# Patient Record
Sex: Male | Born: 1990 | Race: Black or African American | Hispanic: No | Marital: Single
Health system: Southern US, Community
[De-identification: ages and names within clinical notes are randomized; demographics above are authoritative.]

## PROBLEM LIST (undated history)

## (undated) DIAGNOSIS — R569 Unspecified convulsions: Secondary | ICD-10-CM

## (undated) DIAGNOSIS — B20 Human immunodeficiency virus [HIV] disease: Secondary | ICD-10-CM

## (undated) DIAGNOSIS — Z21 Asymptomatic human immunodeficiency virus [HIV] infection status: Secondary | ICD-10-CM

## (undated) DIAGNOSIS — F319 Bipolar disorder, unspecified: Secondary | ICD-10-CM

---

## 2018-01-04 ENCOUNTER — Encounter (HOSPITAL_COMMUNITY): Payer: Self-pay | Admitting: Emergency Medicine

## 2018-01-04 ENCOUNTER — Other Ambulatory Visit: Payer: Self-pay

## 2018-01-04 ENCOUNTER — Emergency Department (HOSPITAL_COMMUNITY)
Admission: EM | Admit: 2018-01-04 | Discharge: 2018-01-04 | Disposition: A | Discharge status: Home / Self Care | Payer: Medicaid Other | Attending: Emergency Medicine | Admitting: Emergency Medicine

## 2018-01-04 DIAGNOSIS — R112 Nausea with vomiting, unspecified: Secondary | ICD-10-CM | POA: Insufficient documentation

## 2018-01-04 DIAGNOSIS — Z5321 Procedure and treatment not carried out due to patient leaving prior to being seen by health care provider: Secondary | ICD-10-CM | POA: Diagnosis not present

## 2018-01-04 HISTORY — DX: Bipolar disorder, unspecified: F31.9

## 2018-01-04 HISTORY — DX: Unspecified convulsions: R56.9

## 2018-01-04 HISTORY — DX: Asymptomatic human immunodeficiency virus (hiv) infection status: Z21

## 2018-01-04 HISTORY — DX: Human immunodeficiency virus (HIV) disease: B20

## 2018-01-04 LAB — CBC
HCT: 41.8 % (ref 39.0–52.0)
Hemoglobin: 14.6 g/dL (ref 13.0–17.0)
MCH: 29.6 pg (ref 26.0–34.0)
MCHC: 34.9 g/dL (ref 30.0–36.0)
MCV: 84.8 fL (ref 78.0–100.0)
Platelets: 185 10*3/uL (ref 150–400)
RBC: 4.93 MIL/uL (ref 4.22–5.81)
RDW: 14.5 % (ref 11.5–15.5)
WBC: 16.4 10*3/uL — ABNORMAL HIGH (ref 4.0–10.5)

## 2018-01-04 LAB — COMPREHENSIVE METABOLIC PANEL
ALK PHOS: 54 U/L (ref 38–126)
ALT: 15 U/L — ABNORMAL LOW (ref 17–63)
AST: 25 U/L (ref 15–41)
Albumin: 4.3 g/dL (ref 3.5–5.0)
Anion gap: 9 (ref 5–15)
BUN: 16 mg/dL (ref 6–20)
CALCIUM: 9.9 mg/dL (ref 8.9–10.3)
CHLORIDE: 96 mmol/L — AB (ref 101–111)
CO2: 29 mmol/L (ref 22–32)
CREATININE: 1.33 mg/dL — AB (ref 0.61–1.24)
GFR calc Af Amer: >60 mL/min (ref >60)
GFR calc non Af Amer: >60 mL/min (ref >60)
Glucose, Bld: 116 mg/dL — ABNORMAL HIGH (ref 65–99)
Potassium: 4.1 mmol/L (ref 3.5–5.1)
SODIUM: 134 mmol/L — AB (ref 135–145)
Total Bilirubin: 0.5 mg/dL (ref 0.3–1.2)
Total Protein: 8.5 g/dL — ABNORMAL HIGH (ref 6.5–8.1)

## 2018-01-04 LAB — LIPASE, BLOOD: LIPASE: 30 U/L (ref 11–51)

## 2018-01-04 MED ORDER — ONDANSETRON 4 MG PO TBDP
4.0000 mg | ORAL_TABLET | Freq: Once | ORAL | Status: AC | PRN
  Administered 2018-01-04: 4 mg via ORAL
  Filled 2018-01-04: qty 1

## 2018-01-04 NOTE — ED Notes (Signed)
PATIENT CALLED THREE TIMES WITH NO ANSWER,PARKING AND BATHROOM CHECKED

## 2018-01-04 NOTE — ED Notes (Signed)
No response when call from lobby.

## 2018-01-04 NOTE — ED Notes (Signed)
Patient handed patient stickers to registration reporting they are not waiting due to wait time.

## 2018-01-04 NOTE — ED Triage Notes (Signed)
Per EMS pt complaint n/v/d onset 0630 yesterday.

## 2018-02-28 ENCOUNTER — Emergency Department (HOSPITAL_COMMUNITY)
Admission: EM | Admit: 2018-02-28 | Discharge: 2018-02-28 | Disposition: A | Discharge status: Home / Self Care | Payer: Medicaid Other | Attending: Emergency Medicine | Admitting: Emergency Medicine

## 2018-02-28 ENCOUNTER — Other Ambulatory Visit: Payer: Self-pay

## 2018-02-28 ENCOUNTER — Encounter (HOSPITAL_COMMUNITY): Payer: Self-pay | Admitting: *Deleted

## 2018-02-28 ENCOUNTER — Emergency Department (HOSPITAL_COMMUNITY): Payer: Medicaid Other

## 2018-02-28 DIAGNOSIS — R079 Chest pain, unspecified: Secondary | ICD-10-CM | POA: Diagnosis present

## 2018-02-28 DIAGNOSIS — Z5321 Procedure and treatment not carried out due to patient leaving prior to being seen by health care provider: Secondary | ICD-10-CM | POA: Insufficient documentation

## 2018-02-28 DIAGNOSIS — F1410 Cocaine abuse, uncomplicated: Secondary | ICD-10-CM

## 2018-02-28 LAB — CBC
HCT: 39.5 % (ref 39.0–52.0)
Hemoglobin: 13 g/dL (ref 13.0–17.0)
MCH: 29 pg (ref 26.0–34.0)
MCHC: 32.9 g/dL (ref 30.0–36.0)
MCV: 88 fL (ref 78.0–100.0)
Platelets: 187 10*3/uL (ref 150–400)
RBC: 4.49 MIL/uL (ref 4.22–5.81)
RDW: 14.6 % (ref 11.5–15.5)
WBC: 5.2 10*3/uL (ref 4.0–10.5)

## 2018-02-28 LAB — BASIC METABOLIC PANEL
ANION GAP: 9 (ref 5–15)
BUN: 5 mg/dL — ABNORMAL LOW (ref 6–20)
CHLORIDE: 105 mmol/L (ref 101–111)
CO2: 24 mmol/L (ref 22–32)
Calcium: 9.4 mg/dL (ref 8.9–10.3)
Creatinine, Ser: 0.93 mg/dL (ref 0.61–1.24)
GFR calc Af Amer: >60 mL/min (ref >60)
GLUCOSE: 84 mg/dL (ref 65–99)
POTASSIUM: 4 mmol/L (ref 3.5–5.1)
Sodium: 138 mmol/L (ref 135–145)

## 2018-02-28 LAB — I-STAT TROPONIN, ED: TROPONIN I, POC: 0 ng/mL (ref 0.00–0.08)

## 2018-02-28 MED ORDER — IBUPROFEN 200 MG PO TABS: 600.0000 mg | ORAL_TABLET | Freq: Once | ORAL | Status: DC

## 2018-02-28 NOTE — ED Notes (Signed)
Pt requesting to leave AMA, pt states, "I have been here and no one is doing anything. I am just going to go." pt encouraged to remain to be seen, pt declines, pt encouraged to come back for further treatment, pt requests to leave AMA, primary RN informed, IV removed, pt ambulatory

## 2018-02-28 NOTE — ED Triage Notes (Signed)
Pt in stating he used cocaine this morning and he thinks someone put something in it, reports chest pain and palpations which does not normally happen for him when he uses, denies other symptoms, no distress noted

## 2018-03-01 NOTE — ED Provider Notes (Signed)
I went to evaluate the patient and he apparently left AMA.  I went to the bed out in the hallway 3 times and there is no patient there.  I assume that he had went to the bathroom.  The nurse informed me that he was leaving AGAINST MEDICAL ADVICE.   Charlestine NightLawyer, Jadalee Westcott, PA-C 03/01/18 1645    Cardama, Amadeo GarnetPedro Eduardo, MD 03/02/18 1250

## 2018-11-10 IMAGING — DX DG CHEST 2V
2 series · 2 of 2 positions shown · non-contrast
Comparison: None.

CLINICAL DATA: Chest pain

EXAM:
CHEST - 2 VIEW

[chest pa]
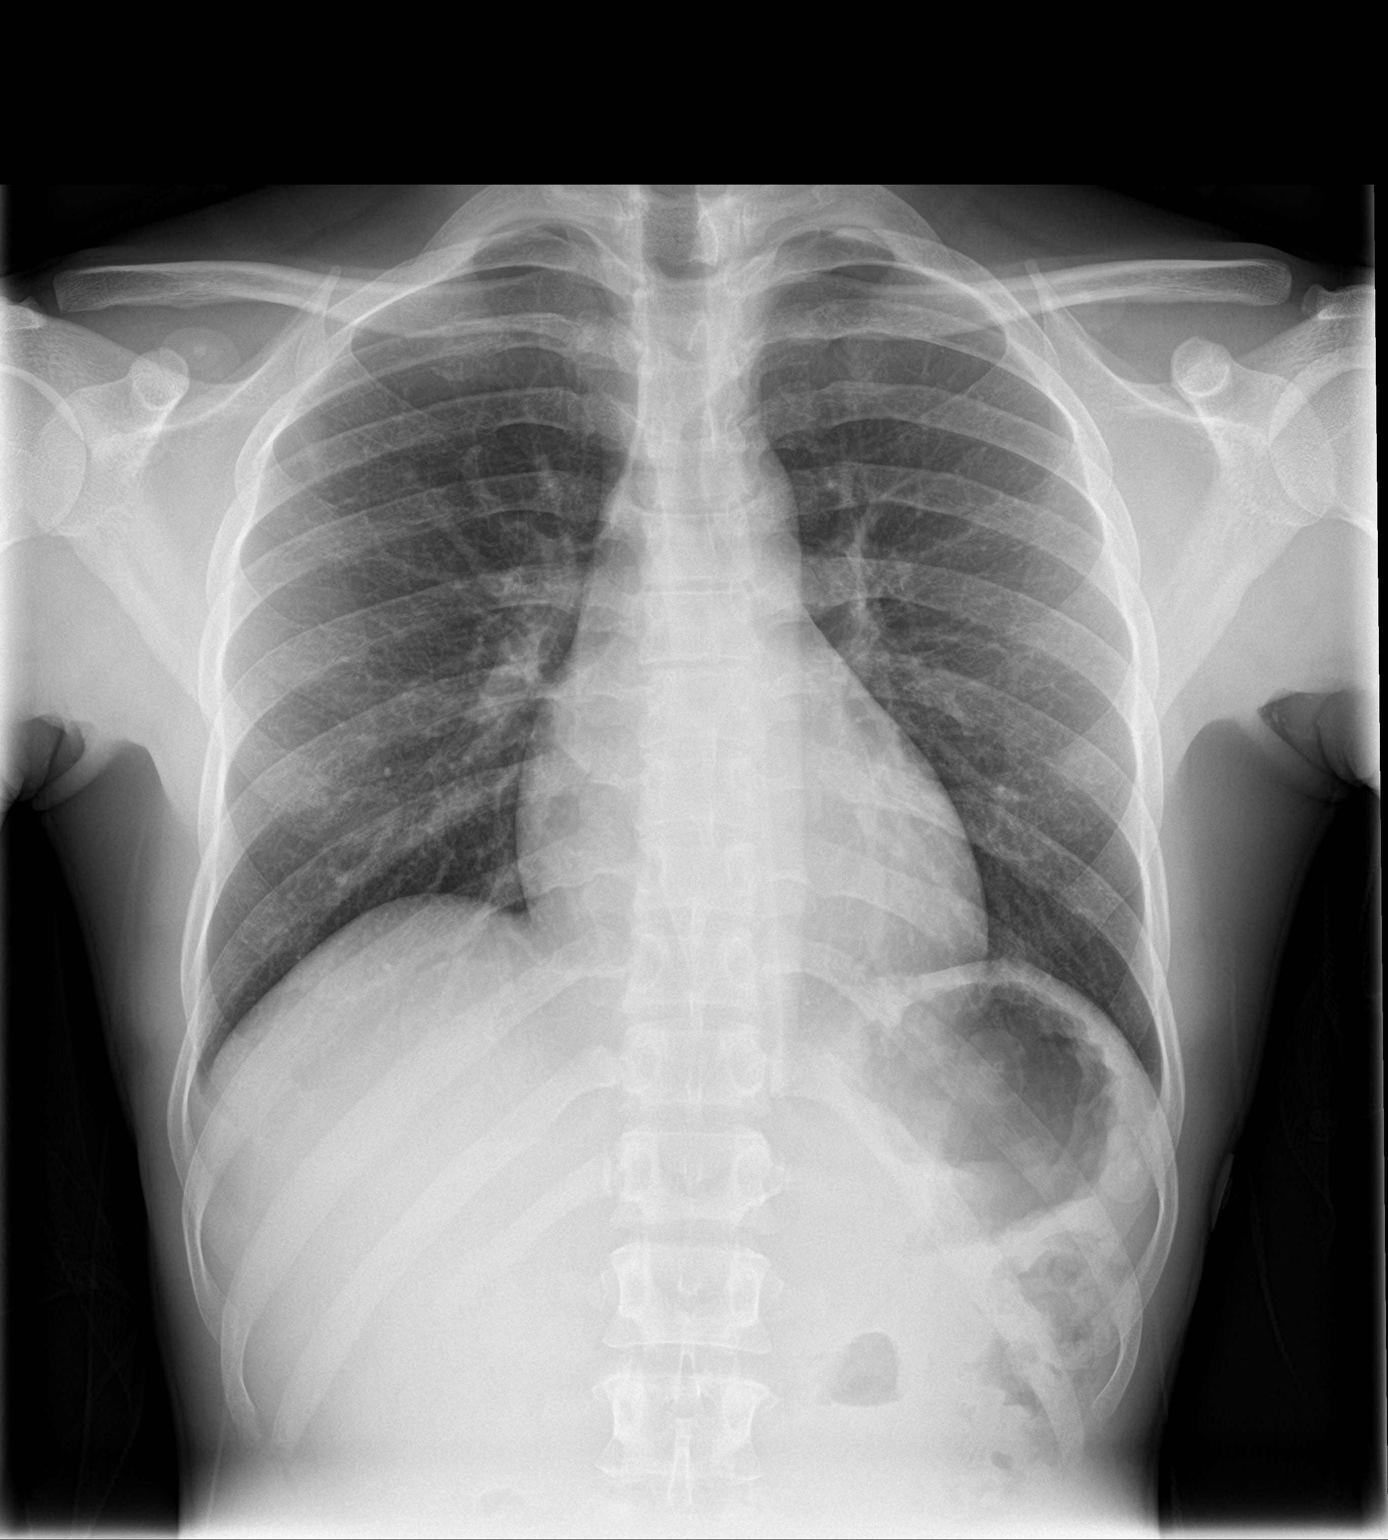

[chest lat]
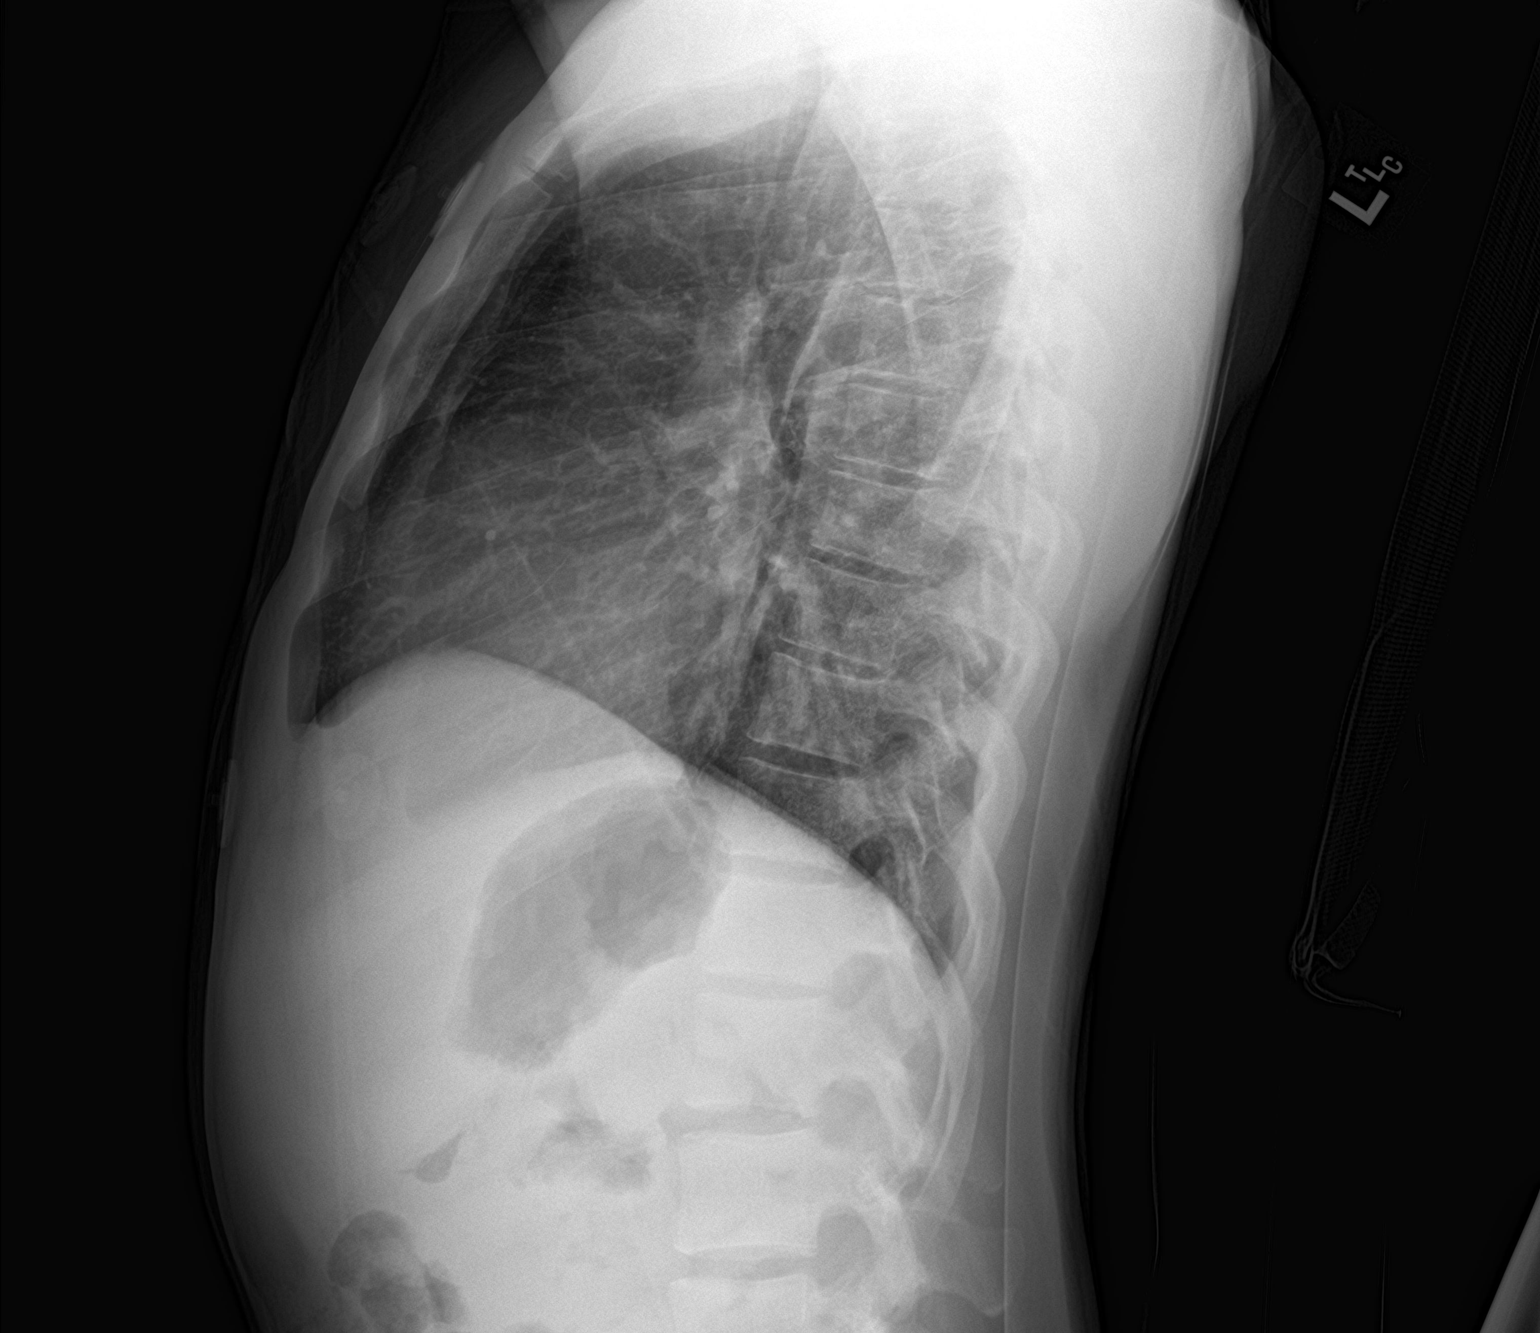

[2 of 2 positions shown; findings below may reference images not displayed]

FINDINGS: The heart size and mediastinal contours are within normal limits.
Both lungs are clear. The visualized skeletal structures are
unremarkable.
IMPRESSION: No active cardiopulmonary disease.

## 2018-12-05 ENCOUNTER — Other Ambulatory Visit: Payer: Self-pay | Admitting: *Deleted

## 2018-12-05 DIAGNOSIS — B20 Human immunodeficiency virus [HIV] disease: Secondary | ICD-10-CM

## 2018-12-07 ENCOUNTER — Other Ambulatory Visit

## 2018-12-07 ENCOUNTER — Ambulatory Visit

## 2018-12-21 ENCOUNTER — Encounter: Payer: Medicaid Other | Admitting: Family

## 2018-12-21 ENCOUNTER — Ambulatory Visit: Payer: Medicaid Other
